# Patient Record
Sex: Male | Born: 1960 | Race: White | Hispanic: No | Marital: Married | Smoking: Never smoker
Health system: Southern US, Community
[De-identification: ages and names within clinical notes are randomized; demographics above are authoritative.]

## PROBLEM LIST (undated history)

## (undated) DIAGNOSIS — K219 Gastro-esophageal reflux disease without esophagitis: Secondary | ICD-10-CM

## (undated) DIAGNOSIS — B0052 Herpesviral keratitis: Secondary | ICD-10-CM

## (undated) DIAGNOSIS — I10 Essential (primary) hypertension: Secondary | ICD-10-CM

## (undated) DIAGNOSIS — H269 Unspecified cataract: Secondary | ICD-10-CM

## (undated) HISTORY — PX: EYE SURGERY: SHX253

## (undated) HISTORY — PX: VASECTOMY: SHX75

## (undated) HISTORY — DX: Herpesviral keratitis: B00.52

## (undated) HISTORY — DX: Essential (primary) hypertension: I10

## (undated) HISTORY — DX: Unspecified cataract: H26.9

## (undated) HISTORY — DX: Gastro-esophageal reflux disease without esophagitis: K21.9

---

## 2011-10-26 ENCOUNTER — Ambulatory Visit (INDEPENDENT_AMBULATORY_CARE_PROVIDER_SITE_OTHER): Payer: 59

## 2011-10-26 DIAGNOSIS — H5710 Ocular pain, unspecified eye: Secondary | ICD-10-CM

## 2011-11-15 ENCOUNTER — Other Ambulatory Visit: Payer: Self-pay | Admitting: Internal Medicine

## 2011-11-21 ENCOUNTER — Other Ambulatory Visit: Payer: Self-pay | Admitting: Internal Medicine

## 2011-12-11 ENCOUNTER — Encounter: Payer: Self-pay | Admitting: Physician Assistant

## 2011-12-13 ENCOUNTER — Encounter: Payer: Self-pay | Admitting: Physician Assistant

## 2011-12-13 ENCOUNTER — Ambulatory Visit (INDEPENDENT_AMBULATORY_CARE_PROVIDER_SITE_OTHER): Payer: 59 | Admitting: Physician Assistant

## 2011-12-13 VITALS — BP 119/75 | HR 70 | Temp 97.4°F | Resp 16 | Ht 70.5 in | Wt 206.0 lb

## 2011-12-13 DIAGNOSIS — Z0000 Encounter for general adult medical examination without abnormal findings: Secondary | ICD-10-CM

## 2011-12-13 DIAGNOSIS — I10 Essential (primary) hypertension: Secondary | ICD-10-CM

## 2011-12-13 DIAGNOSIS — K219 Gastro-esophageal reflux disease without esophagitis: Secondary | ICD-10-CM

## 2011-12-13 LAB — POCT URINALYSIS DIPSTICK
Nitrite, UA: NEGATIVE
Protein, UA: NEGATIVE
Spec Grav, UA: >=1.03
Urobilinogen, UA: 0.2

## 2011-12-13 LAB — CBC WITH DIFFERENTIAL/PLATELET
Basophils Absolute: 0 10*3/uL (ref 0.0–0.1)
Basophils Relative: 1 % (ref 0–1)
Lymphocytes Relative: 36 % (ref 12–46)
MCHC: 35.2 g/dL (ref 30.0–36.0)
Neutro Abs: 3.1 10*3/uL (ref 1.7–7.7)
Neutrophils Relative %: 52 % (ref 43–77)
RDW: 13 % (ref 11.5–15.5)
WBC: 5.9 10*3/uL (ref 4.0–10.5)

## 2011-12-13 LAB — COMPREHENSIVE METABOLIC PANEL
ALT: 59 U/L — ABNORMAL HIGH (ref 0–53)
AST: 32 U/L (ref 0–37)
Albumin: 4.6 g/dL (ref 3.5–5.2)
Alkaline Phosphatase: 72 U/L (ref 39–117)
Chloride: 104 mEq/L (ref 96–112)
Potassium: 5 mEq/L (ref 3.5–5.3)
Sodium: 138 mEq/L (ref 135–145)
Total Protein: 7 g/dL (ref 6.0–8.3)

## 2011-12-13 LAB — LIPID PANEL
LDL Cholesterol: 92 mg/dL (ref 0–99)
Triglycerides: 197 mg/dL — ABNORMAL HIGH (ref <150)
VLDL: 39 mg/dL (ref 0–40)

## 2011-12-13 LAB — TSH: TSH: 0.909 u[IU]/mL (ref 0.350–4.500)

## 2011-12-13 MED ORDER — PANTOPRAZOLE SODIUM 40 MG PO TBEC: 40.0000 mg | DELAYED_RELEASE_TABLET | Freq: Every day | ORAL | Status: DC

## 2011-12-13 MED ORDER — LISINOPRIL 10 MG PO TABS: 10.0000 mg | ORAL_TABLET | Freq: Every day | ORAL | Status: DC

## 2011-12-13 NOTE — Progress Notes (Signed)
  Subjective:    Patient ID: Thomas Brady, male    DOB: Mar 07, 1961, 51 y.o.   MRN: [id number]  HPI  See scanned in form.  Here for CPE.  No problems except shingles R eye, now resolved.  Still seeing ophthalmologist.  Pt has minimal and resolving photophobia.  Review of Systems  All other systems reviewed and are negative.       Objective:   Physical Exam  Constitutional: He is oriented to person, place, and time. He appears well-developed and well-nourished.  HENT:  Head: Normocephalic and atraumatic.  Right Ear: External ear normal.  Left Ear: External ear normal.  Nose: Nose normal.  Mouth/Throat: Oropharynx is clear and moist. No oropharyngeal exudate.  Eyes: Conjunctivae and EOM are normal. Right eye exhibits no discharge. Left eye exhibits no discharge. No scleral icterus.  Neck: Normal range of motion. Neck supple. No thyromegaly present.  Cardiovascular: Normal rate, regular rhythm, normal heart sounds and intact distal pulses.  Exam reveals no gallop and no friction rub.   No murmur heard. Pulmonary/Chest: Effort normal and breath sounds normal.  Abdominal: Soft. Bowel sounds are normal.  Musculoskeletal: Normal range of motion.  Lymphadenopathy:    He has no cervical adenopathy.  Neurological: He is alert and oriented to person, place, and time. He has normal reflexes. No cranial nerve deficit.  Skin: Skin is warm and dry.  Psychiatric: He has a normal mood and affect. His behavior is normal.          Assessment & Plan:  CPE-healthy male.  Htn is stable.  Reflux is stable.  Continue current meds.  Check labs.

## 2011-12-13 NOTE — Patient Instructions (Signed)
See age appropriate handout given.

## 2012-12-16 ENCOUNTER — Other Ambulatory Visit: Payer: Self-pay | Admitting: Physician Assistant

## 2013-01-17 ENCOUNTER — Other Ambulatory Visit: Payer: Self-pay | Admitting: Physician Assistant

## 2013-02-24 ENCOUNTER — Ambulatory Visit (INDEPENDENT_AMBULATORY_CARE_PROVIDER_SITE_OTHER): Payer: BC Managed Care – PPO | Admitting: Family Medicine

## 2013-02-24 VITALS — BP 126/84 | HR 101 | Temp 97.0°F | Resp 16 | Ht 70.0 in | Wt 212.0 lb

## 2013-02-24 DIAGNOSIS — I10 Essential (primary) hypertension: Secondary | ICD-10-CM

## 2013-02-24 DIAGNOSIS — Z79899 Other long term (current) drug therapy: Secondary | ICD-10-CM

## 2013-02-24 DIAGNOSIS — K219 Gastro-esophageal reflux disease without esophagitis: Secondary | ICD-10-CM

## 2013-02-24 DIAGNOSIS — Z1211 Encounter for screening for malignant neoplasm of colon: Secondary | ICD-10-CM

## 2013-02-24 LAB — COMPREHENSIVE METABOLIC PANEL
AST: 35 U/L (ref 0–37)
Alkaline Phosphatase: 67 U/L (ref 39–117)
BUN: 8 mg/dL (ref 6–23)
Calcium: 9.7 mg/dL (ref 8.4–10.5)
Chloride: 106 mEq/L (ref 96–112)
Creat: 1.39 mg/dL — ABNORMAL HIGH (ref 0.50–1.35)
Total Bilirubin: 0.6 mg/dL (ref 0.3–1.2)

## 2013-02-24 MED ORDER — VALACYCLOVIR HCL 500 MG PO TABS: 500.0000 mg | ORAL_TABLET | Freq: Every day | ORAL | Status: DC

## 2013-02-24 MED ORDER — PANTOPRAZOLE SODIUM 40 MG PO TBEC: DELAYED_RELEASE_TABLET | ORAL | Status: DC

## 2013-02-24 MED ORDER — LISINOPRIL 10 MG PO TABS: 10.0000 mg | ORAL_TABLET | Freq: Every day | ORAL | Status: DC

## 2013-02-24 NOTE — Progress Notes (Signed)
Subjective:    Patient ID: Thomas Brady, male    DOB: [birth year], 52 y.o.   MRN: [id number] Chief Complaint  Patient presents with  . Medication Refill   HPI  Mr. Mcgath is doing well.  He is not yet out of any of his medications. He has never had any gastritis or heartburn but does have a constant tickle in his throat along with a chronic dry cough which he has attributed to uncontrolled GERD. He used to be on bid protonix 40mg  but now on qd. Is wondering though if he needs to step up therapy again to help with the cough.  Never misses his lisinopril.  Thinks the cough was present before he started on lisinopril. 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 Does not check bp outside office. No problems w/ compliance or tolerating daily bp med.  Past Medical History  Diagnosis Date  . Hypertension   . GERD (gastroesophageal reflux disease)    Current Outpatient Prescriptions on File Prior to Visit  Medication Sig Dispense Refill  . aspirin 81 MG tablet Take 81 mg by mouth daily.      . fish oil-omega-3 fatty acids 1000 MG capsule Take 1 g by mouth daily.       No current facility-administered medications on file prior to visit.   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 History   Social History  . Marital Status: Married    Spouse Name: N/A    Number of Children: N/A  . Years of Education: N/A   Social History Main Topics  . Smoking status: Never Smoker   . Smokeless tobacco: None  . Alcohol Use: No  . Drug Use: No  . Sexually Active: Yes    Birth Control/ Protection: Surgical   Other Topics Concern  . None   Social History Narrative  . None   Family History  Problem Relation Age of Onset  . Hyperlipidemia Father   . Hypertension Father   . Hyperlipidemia Mother     Review of Systems  Constitutional: Negative for fever and chills.  Eyes: Negative for visual disturbance.  Respiratory: Positive for  cough. Negative for shortness of breath.   Cardiovascular: Negative for chest pain and leg swelling.  Gastrointestinal: Negative for abdominal pain.  Skin: Negative for rash.  Neurological: Negative for dizziness, syncope, facial asymmetry, weakness, light-headedness and headaches.      BP 126/84  Pulse 101  Temp(Src) 97 F (36.1 C) (Oral)  Resp 16  Ht 5\' 10"  (1.778 m)  Wt 212 lb (96.163 kg)  BMI 30.42 kg/m2  SpO2 96% Objective:   Physical Exam  Constitutional: He is oriented to person, place, and time. He appears well-developed and well-nourished. No distress.  HENT:  Head: Normocephalic and atraumatic.  Eyes: Conjunctivae are normal. Pupils are equal, round, and reactive to light. No scleral icterus.  Neck: Normal range of motion. Neck supple. No thyromegaly present.  Cardiovascular: Normal rate, regular rhythm, normal heart sounds and intact distal pulses.   Pulmonary/Chest: Effort normal and breath sounds normal. No respiratory distress.  Musculoskeletal: He exhibits no edema.  Lymphadenopathy:    He has no cervical adenopathy.  Neurological: He is alert and oriented to person, place, and time.  Skin: Skin is warm and dry. He is not diaphoretic.  Psychiatric: He has a normal mood and affect. His behavior is normal.          Assessment &  Plan:  Special screening for malignant neoplasms, colon - Plan: Ambulatory referral to Gastroenterology for initial routine screening colonoscopy.  Encounter for long-term (current) use of other medications - Plan: Comprehensive metabolic panel.  Suspect lisinopril is causing cough - hold x 1 wk to see if cough resolves. If it does not - restart lisinopril 10 and cons stepping up therapy for gerd.   GERD - consider trying to wean off ppi in future if cough not GERD/LRD related.  HTN - Cont lisinopril if no side effects. However, if cough resolves after 1 wk off lisinopril. Rec checking bp outside office several times to see if he needs  to restart alternative med.  Occular HSV - cont suppression valtrex  HM - nml annual CPE last yr - rec repeat in 2015 w/ fasting labs.  Meds ordered this encounter  Medications  . DISCONTD: valACYclovir (VALTREX) 500 MG tablet    Sig: Take 500 mg by mouth daily.  . pantoprazole (PROTONIX) 40 MG tablet    Sig: TAKE 1 TABLET BY MOUTH EVERY DAY    Dispense:  90 tablet    Refill:  3  . lisinopril (PRINIVIL,ZESTRIL) 10 MG tablet    Sig: Take 1 tablet (10 mg total) by mouth daily.    Dispense:  90 tablet    Refill:  3  . valACYclovir (VALTREX) 500 MG tablet    Sig: Take 1 tablet (500 mg total) by mouth daily.    Dispense:  90 tablet    Refill:  3

## 2013-03-10 ENCOUNTER — Other Ambulatory Visit: Payer: Self-pay | Admitting: Gastroenterology

## 2013-03-16 ENCOUNTER — Ambulatory Visit
Admission: RE | Admit: 2013-03-16 | Discharge: 2013-03-16 | Disposition: A | Discharge status: Home / Self Care | Payer: BC Managed Care – PPO | Source: Ambulatory Visit | Attending: Gastroenterology | Admitting: Gastroenterology

## 2013-03-18 ENCOUNTER — Telehealth: Payer: Self-pay | Admitting: Radiology

## 2013-03-18 NOTE — Telephone Encounter (Signed)
Patient phone call per Dr Cleta Alberts, normal [place], pt has been advised by Dr Elnoria Howard already.

## 2013-03-19 ENCOUNTER — Ambulatory Visit (INDEPENDENT_AMBULATORY_CARE_PROVIDER_SITE_OTHER): Payer: BC Managed Care – PPO | Admitting: Family Medicine

## 2013-03-19 VITALS — BP 120/80 | HR 81 | Temp 98.6°F | Resp 17 | Ht 70.5 in | Wt 216.8 lb

## 2013-03-19 DIAGNOSIS — J029 Acute pharyngitis, unspecified: Secondary | ICD-10-CM

## 2013-03-19 LAB — POCT RAPID STREP A (OFFICE): Rapid Strep A Screen: NEGATIVE

## 2013-03-19 MED ORDER — PENICILLIN V POTASSIUM 500 MG PO TABS: 500.0000 mg | ORAL_TABLET | Freq: Three times a day (TID) | ORAL | Status: DC

## 2013-03-19 NOTE — Progress Notes (Signed)
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 Is no fever, cough, nausea, vomiting, or ear pain  Objective:  Alert, NAD Neck supple, no adenopathy Throat:  Swollen pendulous uvula Results for orders placed in visit on 02/24/13  COMPREHENSIVE METABOLIC PANEL      Result Value Range   Sodium 142  135 - 145 mEq/L   Potassium 4.4  3.5 - 5.3 mEq/L   Chloride 106  96 - 112 mEq/L   CO2 27  19 - 32 mEq/L   Glucose, Bld 111 (*) 70 - 99 mg/dL   BUN 8  6 - 23 mg/dL   Creat 1.61 (*) 0.96 - 1.35 mg/dL   Total Bilirubin 0.6  0.3 - 1.2 mg/dL   Alkaline Phosphatase 67  39 - 117 U/L   AST 35  0 - 37 U/L   ALT 53  0 - 53 U/L   Total Protein 7.6  6.0 - 8.3 g/dL   Albumin 4.6  3.5 - 5.2 g/dL   Calcium 9.7  8.4 - 04.5 mg/dL   Results for orders placed in visit on 03/19/13  POCT RAPID STREP A (OFFICE)      Result Value Range   Rapid Strep A Screen Negative  Negative   ]   Assessment:  Acute pharyngitis  Acute pharyngitis - Plan: POCT rapid strep A, Culture, Group A Strep, penicillin v potassium (VEETID) 500 MG tablet  Signed, Elvina Sidle, MD

## 2014-05-10 DIAGNOSIS — Z0271 Encounter for disability determination: Secondary | ICD-10-CM

## 2014-05-16 ENCOUNTER — Other Ambulatory Visit: Payer: Self-pay | Admitting: Family Medicine

## 2014-05-24 ENCOUNTER — Encounter: Payer: Self-pay | Admitting: Family Medicine

## 2014-05-24 DIAGNOSIS — B0052 Herpesviral keratitis: Secondary | ICD-10-CM | POA: Insufficient documentation

## 2014-06-08 IMAGING — US US ABDOMEN COMPLETE
1 series · 14 of 25 positions shown · non-contrast
Comparison: None.

CLINICAL DATA: Elevated liver function studies.

COMPLETE ABDOMINAL ULTRASOUND

[Series 1: us abdomen complete · 0.26mm/px · 14 of 83 slices shown]
[im 1/83]
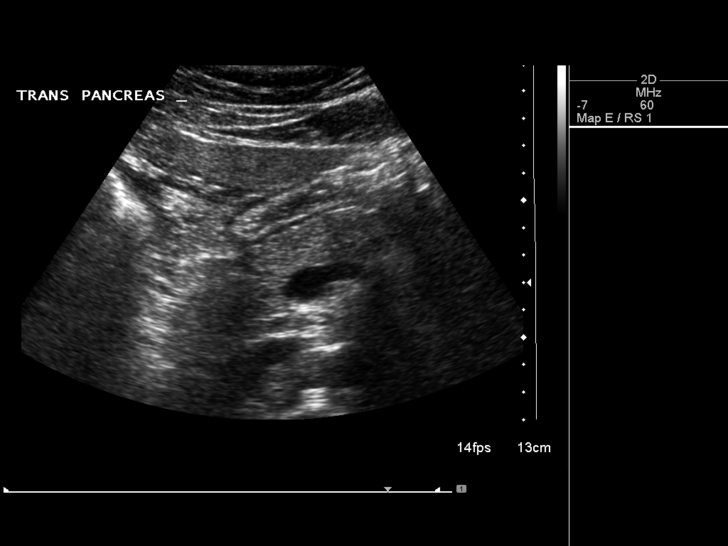
[im 7/83]
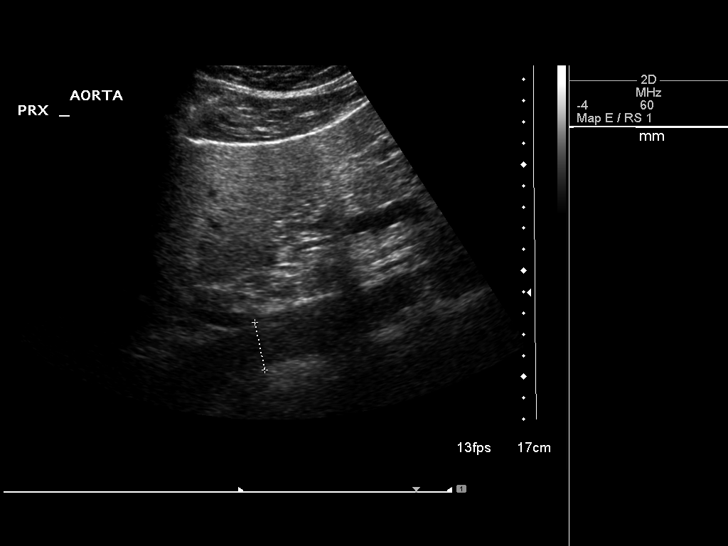
[im 14/83]
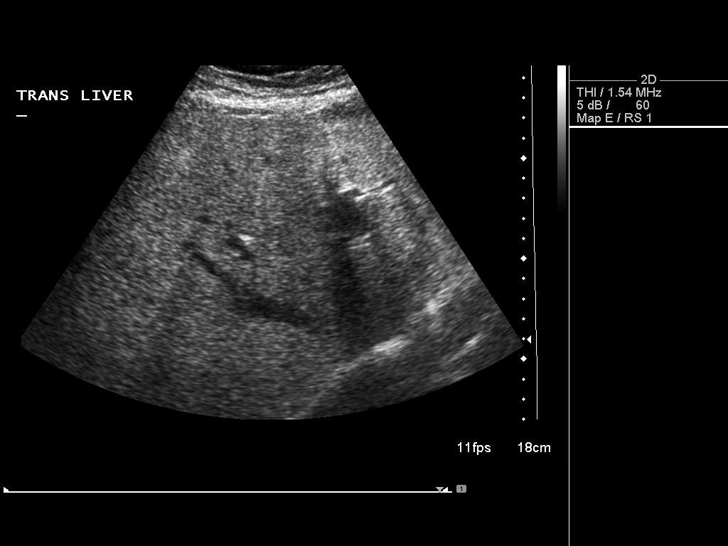
[im 21/83]
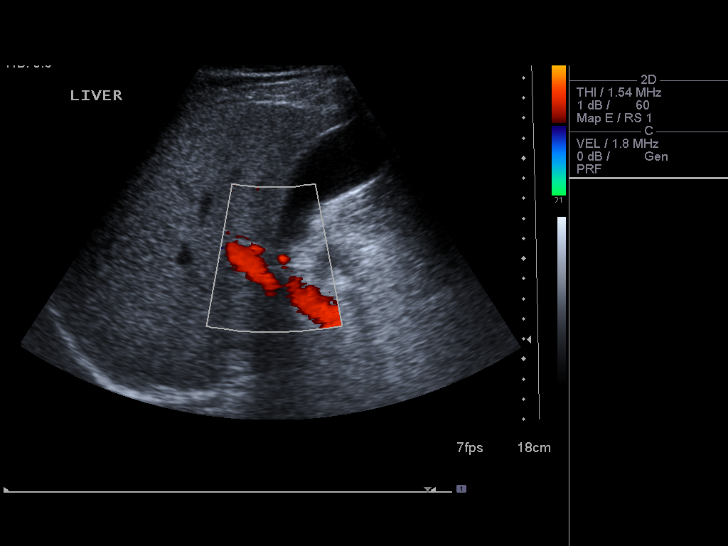
[im 28/83]
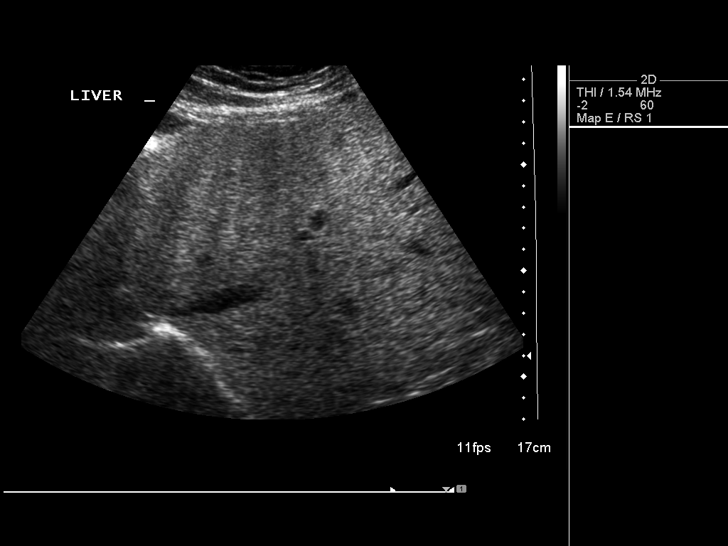
[im 31/83]
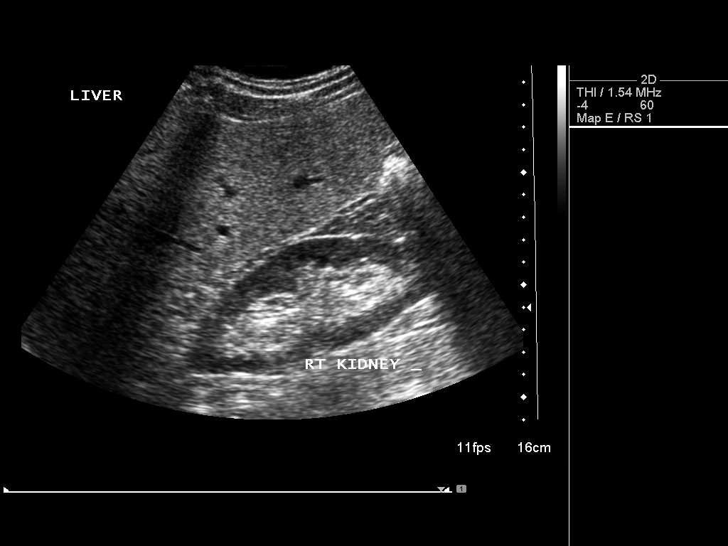
[im 38/83]
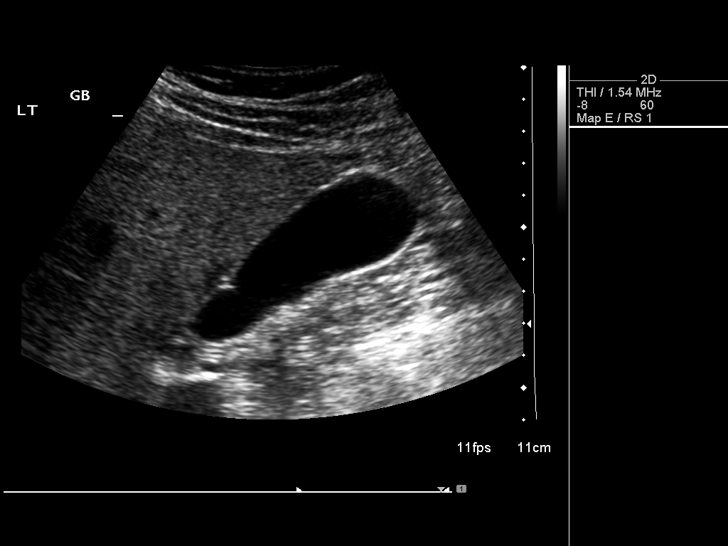
[im 45/83]
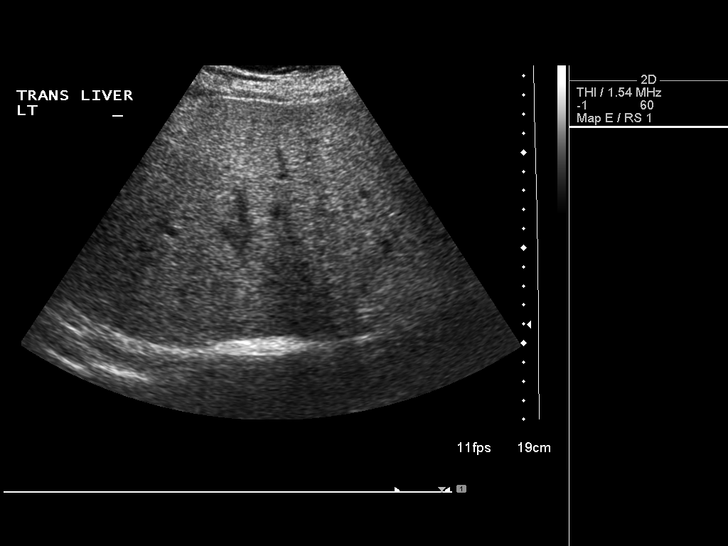
[im 52/83]
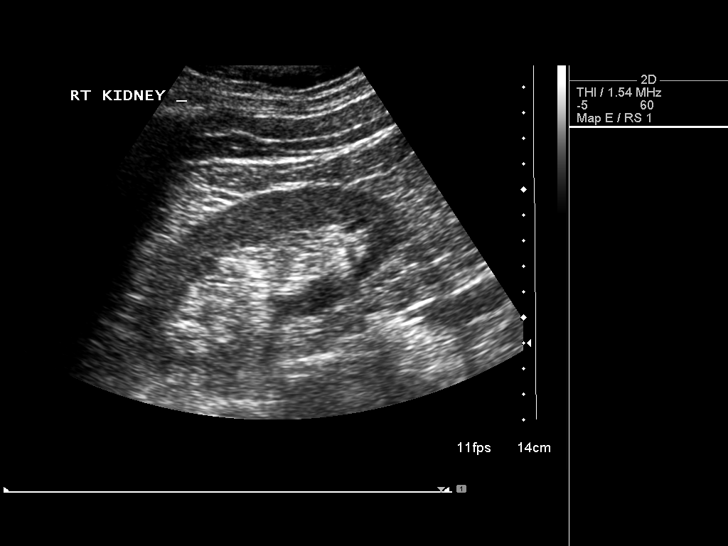
[im 55/83]
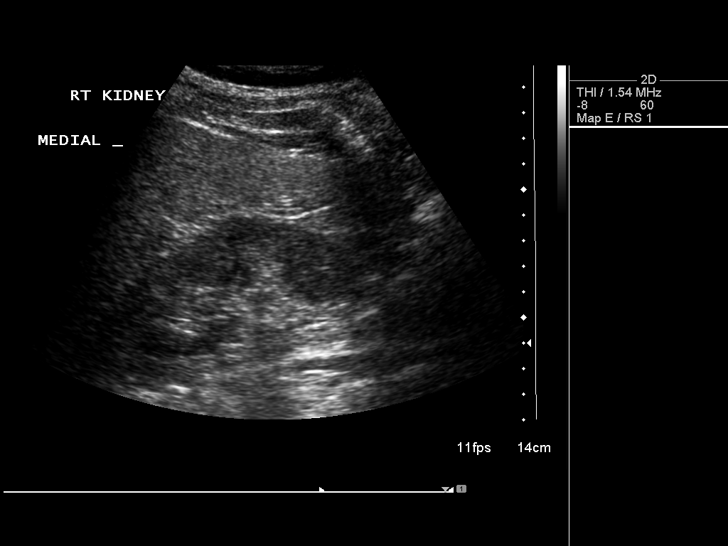
[im 62/83]
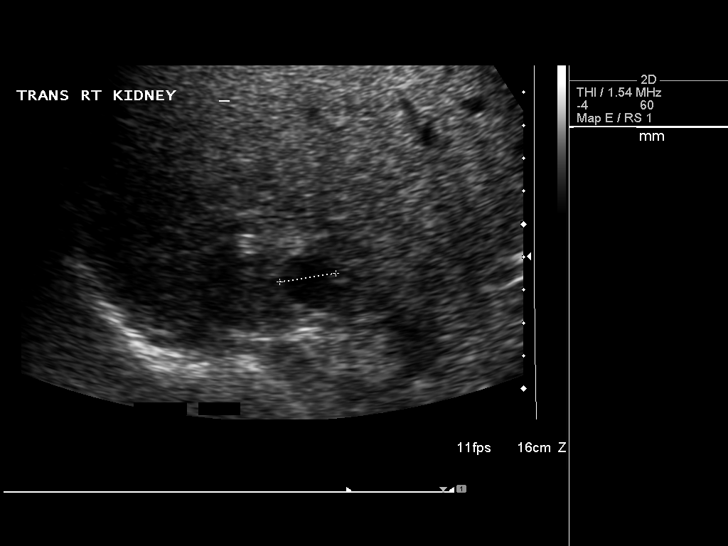
[im 69/83]
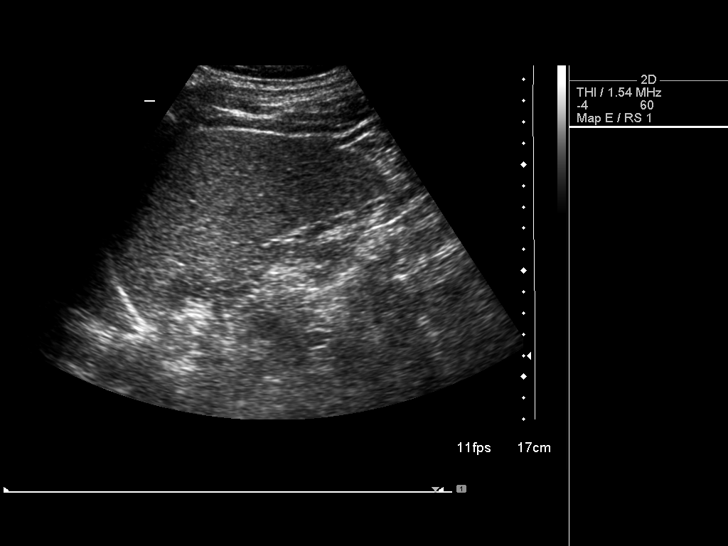
[im 76/83]
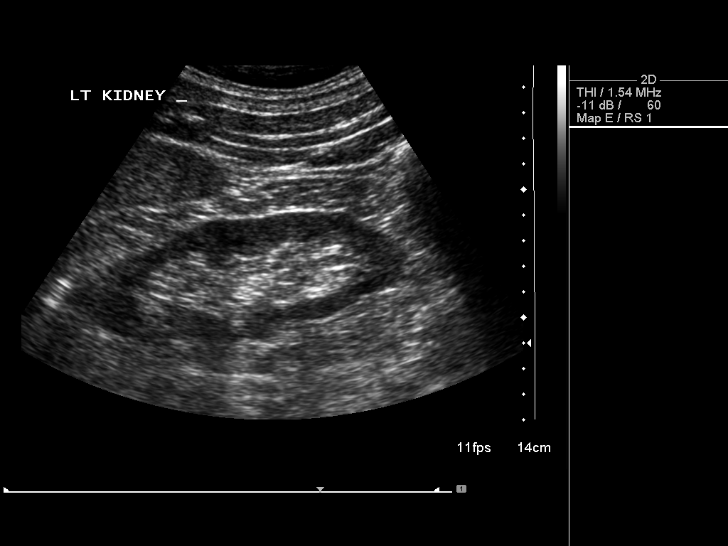
[im 83/83]
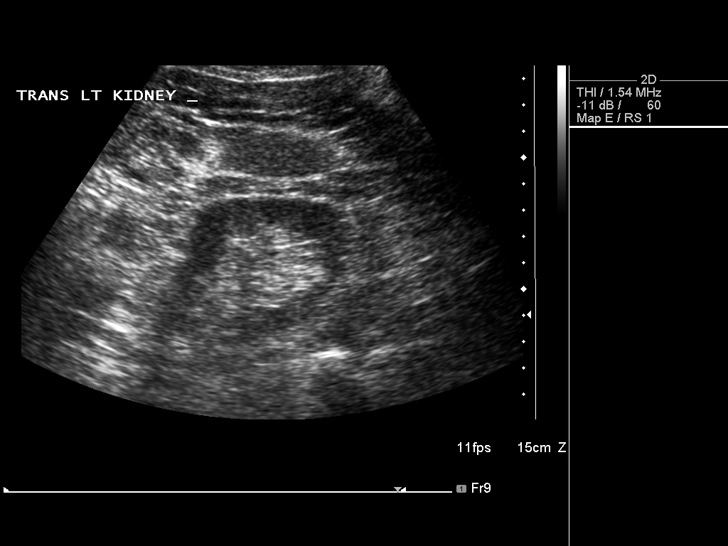

[14 of 25 positions shown; findings below may reference images not displayed]

FINDINGS: Gallbladder:  No gallstones, gallbladder wall thickening, or
pericholecystic fluid.

Common bile duct:  Normal in caliber measuring a maximum of 3.6mm.

Liver:  There is diffuse increased echogenicity of the liver and
decreased through transmission consistent with fatty infiltration.
No focal lesions or biliary dilatation. There is a simple appearing
1.3 x 0.8 x 1.0 cm cyst in the anterior aspect of the right lobe.

IVC:  Normal caliber.

Pancreas:  Sonographically unremarkable.

Spleen:  Normal size and echogenicity without focal lesions.

Right Kidney:  12.4 cm in length.  Normal renal cortical thickness
and echogenicity without hydronephrosis.  There is a 1.9 x 1.7 x
1.7 cm a simple appearing upper pole cyst and a 1.1 x 0.8 x 1.0 cm
lower pole cyst.

Left Kidney:  13.0 cm in length. Normal renal cortical thickness
and echogenicity without focal lesions or hydronephrosis.

Abdominal aorta:  Normal caliber.
IMPRESSION: 1.  Normal gallbladder and normal caliber common bile duct.
2.  Simple appearing hepatic and renal cysts.

## 2014-07-09 ENCOUNTER — Other Ambulatory Visit: Payer: Self-pay | Admitting: Family Medicine

## 2014-07-12 ENCOUNTER — Ambulatory Visit (INDEPENDENT_AMBULATORY_CARE_PROVIDER_SITE_OTHER): Payer: BC Managed Care – PPO | Admitting: Family Medicine

## 2014-07-12 VITALS — BP 110/78 | HR 71 | Temp 97.9°F | Resp 18 | Ht 70.25 in | Wt 218.8 lb

## 2014-07-12 DIAGNOSIS — H16201 Unspecified keratoconjunctivitis, right eye: Secondary | ICD-10-CM

## 2014-07-12 DIAGNOSIS — Z23 Encounter for immunization: Secondary | ICD-10-CM

## 2014-07-12 DIAGNOSIS — K219 Gastro-esophageal reflux disease without esophagitis: Secondary | ICD-10-CM

## 2014-07-12 DIAGNOSIS — I10 Essential (primary) hypertension: Secondary | ICD-10-CM

## 2014-07-12 MED ORDER — LISINOPRIL 10 MG PO TABS: 10.0000 mg | ORAL_TABLET | Freq: Every day | ORAL | Status: DC

## 2014-07-12 MED ORDER — PANTOPRAZOLE SODIUM 40 MG PO TBEC: 40.0000 mg | DELAYED_RELEASE_TABLET | Freq: Every day | ORAL | Status: DC

## 2014-07-12 MED ORDER — VALACYCLOVIR HCL 500 MG PO TABS: 500.0000 mg | ORAL_TABLET | Freq: Every day | ORAL | Status: DC

## 2014-07-12 NOTE — Progress Notes (Signed)
This chart was scribed for Elvina SidleKurt Keyvon Herter, MD by Marica OtterNusrat Rahman, ED Scribe at [facility]. This patient was seen in room Room 1 and the patient's care was started at 11:00 AM.  Patient ID: Thomas Brady MRN: [id number], DOB: 1961-07-08, 53 y.o. Date of Encounter: 07/12/2014, 11:00 AM  Primary Physician: No PCP Per Patient  Chief Complaint  Patient presents with  . Medication Refill    Lisinopril/Pantoprazole  . 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 Pt seeks a refill of Pantoprazole, Valtrex, lisinopril, and nexium. Pt denies experiencing any side effects from his meds. Pt reports he has been on the lisinopril for 3-5 years. Pt reports he takes steroid drops and acyclovir for a viral infection of the right eye (herpes keratitis, right eye). Pt also would like a flu shot today and is open to receiving a pneumonia shot.    Personal Hx: Pt is traveling to [place] in 5 days for two weeks to celebrate his wife's 50th birthday and his daughter's 8910th birthday. This will be pt's first visit to [place], though his wife lived there for 4-5 years.   Past Medical History  Diagnosis Date  . Hypertension   . GERD (gastroesophageal reflux disease)   . Herpes keratitis   . Cataract     Home Meds: Prior to Admission medications   Medication Sig Start Date End Date Taking? Authorizing Provider  fish oil-omega-3 fatty acids 1000 MG capsule Take 1 g by mouth daily.   Yes Historical Provider, MD  pantoprazole (PROTONIX) 40 MG tablet Take 1 tablet (40 mg total) by mouth daily. PATIENT NEEDS OFFICE VISIT FOR ADDITIONAL REFILLS 05/17/14  Yes Sherren MochaEva N Shaw, MD  valACYclovir (VALTREX) 500 MG tablet Take 1 tablet (500 mg total) by mouth daily. PATIENT NEEDS OFFICE VISIT FOR ADDITIONAL REFILLS 05/17/14  Yes Sherren MochaEva N Shaw, MD  lisinopril (PRINIVIL,ZESTRIL) 10 MG tablet Take 1 tablet (10 mg total) by mouth daily. 02/24/13  02/24/14  Sherren MochaEva N Shaw, MD  Allergies: No Known Allergies  History   Social History  . Marital Status: Married    Spouse Name: N/A    Number of Children: N/A  . Years of Education: N/A   Occupational History  . Not on file.   Social History Main Topics  . Smoking status: Never Smoker   . Smokeless tobacco: Not on file  . Alcohol Use: No  . Drug Use: No  . Sexual Activity: Yes    Birth Control/ Protection: Surgical   Other Topics Concern  . Not on file   Social History Narrative  . No narrative on file   Review of Systems: Constitutional: negative for chills, fever, night sweats, weight changes, or fatigue  HEENT: negative for vision changes, hearing loss, congestion, rhinorrhea, ST, epistaxis, or sinus pressure Cardiovascular: negative for chest pain or palpitations Respiratory: negative for hemoptysis, wheezing, shortness of breath, or cough Abdominal: negative for abdominal pain, nausea, vomiting, diarrhea, or constipation Dermatological: negative for rash Neurologic: negative for headache, dizziness, or syncope All other systems reviewed and are otherwise negative with the exception to those above and in the HPI.  Physical Exam: Blood pressure 110/78, pulse 71, temperature 97.9 F (36.6 C), temperature source Oral, resp. rate 18, height 5' 10.25" (1.784 m), weight 218 lb 12.8 oz (99.247 kg), SpO2 97.00%., Body mass index is 31.18 kg/(m^2). General: Well developed, well nourished, in no acute distress.  Head: Normocephalic, atraumatic, eyes without discharge, sclera non-icteric, nares are without discharge. Bilateral auditory canals clear, TM's are without perforation, pearly grey and translucent with reflective cone of light bilaterally. Oral cavity moist, posterior pharynx without exudate, erythema, peritonsillar abscess, or post nasal drip.  Neck: Supple. No thyromegaly. Full ROM. No lymphadenopathy. Lungs: Clear bilaterally to auscultation without wheezes, rales, or  rhonchi. Breathing is unlabored. Heart: RRR with S1 S2. No murmurs, rubs, or gallops appreciated. Abdomen: Soft, non-tender, non-distended with normoactive bowel sounds. No hepatomegaly. No rebound/guarding. No obvious abdominal masses. Msk:  Strength and tone normal for age. Extremities/Skin: Warm and dry. No clubbing or cyanosis. No edema. No rashes or suspicious lesions. Neuro: Alert and oriented X 3. Moves all extremities spontaneously. Gait is normal. CNII-XII grossly in tact. Psych:  Responds to questions appropriately with a normal affect.   Labs: ASSESSMENT AND PLAN:  DIAGNOSTIC STUDIES: Oxygen Saturation is 97% on RA, nl by my interpretation.    COORDINATION OF CARE: 11:04 AM-Discussed treatment plan which includes meds refill, flu shot, pneumonia shot, and advise pt to skip a couple of doses of pantoprozole each week, with pt at bedside and pt agreed to plan.   53 y.o. year old male with Essential hypertension - Plan: lisinopril (PRINIVIL,ZESTRIL) 10 MG tablet, DISCONTINUED: lisinopril (PRINIVIL,ZESTRIL) 10 MG tablet  Keratoconjunctivitis, right - Plan: valACYclovir (VALTREX) 500 MG tablet  Gastroesophageal reflux disease without esophagitis - Plan: pantoprazole (PROTONIX) 40 MG tablet  Need for prophylactic vaccination and inoculation against influenza - Plan: Flu Vaccine QUAD 36+ mos IM  Need for vaccination with 13-polyvalent pneumococcal conjugate vaccine - Plan: Pneumococcal conjugate vaccine 13-valent IM     Signed, Elvina SidleKurt Elisama Thissen, MD 07/12/2014 11:00 AM  I personally performed the services described in this documentation, which was scribed in my presence. The recorded information has been reviewed and is accurate.

## 2014-07-12 NOTE — Patient Instructions (Signed)
Immunization Schedule, Adult  Influenza vaccine.  All adults should be immunized every year.  All adults, including pregnant women and people with hives-only allergy to eggs can receive the inactivated influenza (IIV) vaccine.  Adults aged 52-49 years can receive the recombinant influenza (RIV) vaccine. The RIV vaccine does not contain any egg protein.  Adults aged 76 years or older can receive the standard-dose IIV or the high-dose IIV.  Tetanus, diphtheria, and acellular pertussis (Td, Tdap) vaccine.  Pregnant women should receive 1 dose of Tdap vaccine during each pregnancy. The dose should be obtained regardless of the length of time since the last dose. Immunization is preferred during the 27th to 36th week of gestation.  An adult who has not previously received Tdap or who does not know his or her vaccine status should receive 1 dose of Tdap. This initial dose should be followed by tetanus and diphtheria toxoids (Td) booster doses every 10 years.  Adults with an unknown or incomplete history of completing a 3-dose immunization series with Td-containing vaccines should begin or complete a primary immunization series including a Tdap dose.  Adults should receive a Td booster every 10 years.  Varicella vaccine.  An adult without evidence of immunity to varicella should receive 2 doses or a second dose if he or she has previously received 1 dose.  Pregnant females who do not have evidence of immunity should receive the first dose after pregnancy. This first dose should be obtained before leaving the health care facility. The second dose should be obtained 4-8 weeks after the first dose.  Human papillomavirus (HPV) vaccine.  Females aged 13-26 years who have not received the vaccine previously should obtain the 3-dose series.  The vaccine is not recommended for use in pregnant females. However, pregnancy testing is not needed before receiving a dose. If a male is found to be  pregnant after receiving a dose, no treatment is needed. In that case, the remaining doses should be delayed until after the pregnancy.  Males aged 37-21 years who have not received the vaccine previously should receive the 3-dose series. Males aged 22-26 years may be immunized.  Immunization is recommended through the age of 93 years for any male who has sex with males and did not get any or all doses earlier.  Immunization is recommended for any person with an immunocompromised condition through the age of 71 years if he or she did not get any or all doses earlier.  During the 3-dose series, the second dose should be obtained 4-8 weeks after the first dose. The third dose should be obtained 24 weeks after the first dose and 16 weeks after the second dose.  Zoster vaccine.  One dose is recommended for adults aged 73 years or older unless certain conditions are present.  Measles, mumps, and rubella (MMR) vaccine.  Adults born before 35 generally are considered immune to measles and mumps.  Adults born in [birth year] or later should have 1 or more doses of MMR vaccine unless there is a contraindication to the vaccine or there is laboratory evidence of immunity to each of the three diseases.  A routine second dose of MMR vaccine should be obtained at least 28 days after the first dose for students attending postsecondary schools, health care workers, or international travelers.  People who received inactivated measles vaccine or an unknown type of measles vaccine during 1963-1967 should receive 2 doses of MMR vaccine.  People who received inactivated mumps vaccine or an unknown type  of mumps vaccine before 1979 and are at high risk for mumps infection should consider immunization with 2 doses of MMR vaccine.  For females of childbearing age, rubella immunity should be determined. If there is no evidence of immunity, females who are not pregnant should be vaccinated. If there is no evidence of  immunity, females who are pregnant should delay immunization until after pregnancy.  Unvaccinated health care workers born before 1957 who lack laboratory evidence of measles, mumps, or rubella immunity or laboratory confirmation of disease should consider measles and mumps immunization with 2 doses of MMR vaccine or rubella immunization with 1 dose of MMR vaccine.  Pneumococcal 13-valent conjugate (PCV13) vaccine.  When indicated, a person who is uncertain of his or her immunization history and has no record of immunization should receive the PCV13 vaccine.  An adult aged 19 years or older who has certain medical conditions and has not been previously immunized should receive 1 dose of PCV13 vaccine. This PCV13 should be followed with a dose of pneumococcal polysaccharide (PPSV23) vaccine. The PPSV23 vaccine dose should be obtained at least 8 weeks after the dose of PCV13 vaccine.  An adult aged 19 years or older who has certain medical conditions and previously received 1 or more doses of PPSV23 vaccine should receive 1 dose of PCV13. The PCV13 vaccine dose should be obtained 1 or more years after the last PPSV23 vaccine dose.  Pneumococcal polysaccharide (PPSV23) vaccine.  When PCV13 is also indicated, PCV13 should be obtained first.  All adults aged 65 years and older should be immunized.  An adult younger than age 65 years who has certain medical conditions should be immunized.  Any person who resides in a nursing home or long-term care facility should be immunized.  An adult smoker should be immunized.  People with an immunocompromised condition and certain other conditions should receive both PCV13 and PPSV23 vaccines.  People with human immunodeficiency virus (HIV) infection should be immunized as soon as possible after diagnosis.  Immunization during chemotherapy or radiation therapy should be avoided.  Routine use of PPSV23 vaccine is not recommended for American Indians,  [place] Natives, or people younger than 65 years unless there are medical conditions that require PPSV23 vaccine.  When indicated, people who have unknown immunization and have no record of immunization should receive PPSV23 vaccine.  One-time revaccination 5 years after the first dose of PPSV23 is recommended for people aged 19-64 years who have chronic kidney failure, nephrotic syndrome, asplenia, or immunocompromised conditions.  People who received 1-2 doses of PPSV23 before age 65 years should receive another dose of PPSV23 vaccine at age 65 years or later if at least 5 years have passed since the previous dose.  Doses of PPSV23 are not needed for people immunized with PPSV23 at or after age 65 years.  Meningococcal vaccine.  Adults with asplenia or persistent complement component deficiencies should receive 2 doses of quadrivalent meningococcal conjugate (MenACWY-D) vaccine. The doses should be obtained at least 2 months apart.  Microbiologists working with certain meningococcal bacteria, military recruits, people at risk during an outbreak, and people who travel to or live in countries with a high rate of meningitis should be immunized.  A first-year college student up through age 21 years who is living in a residence hall should receive a dose if he or she did not receive a dose on or after his or her 16th birthday.  Adults who have certain high-risk conditions should receive one or more doses   of vaccine.  Hepatitis A vaccine.  Adults who wish to be protected from this disease, have certain high-risk conditions, work with hepatitis A-infected animals, work in hepatitis A research labs, or travel to or work in countries with a high rate of hepatitis A should be immunized.  Adults who were previously unvaccinated and who anticipate close contact with an international adoptee during the first 60 days after arrival in the [place] from a country with a high rate of hepatitis A should  be immunized.  Hepatitis B vaccine.  Adults who wish to be protected from this disease, have certain high-risk conditions, may be exposed to blood or other infectious body fluids, are household contacts or sex partners of hepatitis B positive people, are clients or workers in certain care facilities, or travel to or work in countries with a high rate of hepatitis B should be immunized.  Haemophilus influenzae type b (Hib) vaccine.  A previously unvaccinated person with asplenia or sickle cell disease or having a scheduled splenectomy should receive 1 dose of Hib vaccine.  Regardless of previous immunization, a recipient of a hematopoietic stem cell transplant should receive a 3-dose series 6-12 months after his or her successful transplant.  Hib vaccine is not recommended for adults with HIV infection. Document Released: 12/07/2003 Document Revised: 01/11/2013 Document Reviewed: 11/03/2012 Exit[employer] Patient Information 2015 [employer]. This information is not intended to replace advice given to you by your health care provider. Make sure you discuss any questions you have with your health care provider. Hypertension Hypertension, commonly called high blood pressure, is when the force of blood pumping through your arteries is too strong. Your arteries are the blood vessels that carry blood from your heart throughout your body. A blood pressure reading consists of a higher number over a lower number, such as 110/72. The higher number (systolic) is the pressure inside your arteries when your heart pumps. The lower number (diastolic) is the pressure inside your arteries when your heart relaxes. Ideally you want your blood pressure below 120/80. Hypertension forces your heart to work harder to pump blood. Your arteries may become narrow or stiff. Having hypertension puts you at risk for heart disease, stroke, and other problems.  RISK FACTORS Some risk factors for high blood pressure are  controllable. Others are not.  Risk factors you cannot control include:   Race. You may be at higher risk if you are African American.  Age. Risk increases with age.  Gender. Men are at higher risk than women before age 45 years. After age 65, women are at higher risk than men. Risk factors you can control include:  Not getting enough exercise or physical activity.  Being overweight.  Getting too much fat, sugar, calories, or salt in your diet.  Drinking too much alcohol. SIGNS AND SYMPTOMS Hypertension does not usually cause signs or symptoms. Extremely high blood pressure (hypertensive crisis) may cause headache, anxiety, shortness of breath, and nosebleed. DIAGNOSIS  To check if you have hypertension, your health care provider will measure your blood pressure while you are seated, with your arm held at the level of your heart. It should be measured at least twice using the same arm. Certain conditions can cause a difference in blood pressure between your right and left arms. A blood pressure reading that is higher than normal on one occasion does not mean that you need treatment. If one blood pressure reading is high, ask your health care provider about having it checked again. TREATMENT    Treating high blood pressure includes making lifestyle changes and possibly taking medicine. Living a healthy lifestyle can help lower high blood pressure. You may need to change some of your habits. Lifestyle changes may include:  Following the DASH diet. This diet is high in fruits, vegetables, and whole grains. It is low in salt, red meat, and added sugars.  Getting at least 2 hours of brisk physical activity every week.  Losing weight if necessary.  Not smoking.  Limiting alcoholic beverages.  Learning ways to reduce stress. If lifestyle changes are not enough to get your blood pressure under control, your health care provider may prescribe medicine. You may need to take more than one.  Work closely with your health care provider to understand the risks and benefits. HOME CARE INSTRUCTIONS  Have your blood pressure rechecked as directed by your health care provider.   Take medicines only as directed by your health care provider. Follow the directions carefully. Blood pressure medicines must be taken as prescribed. The medicine does not work as well when you skip doses. Skipping doses also puts you at risk for problems.   Do not smoke.   Monitor your blood pressure at home as directed by your health care provider. SEEK MEDICAL CARE IF:   You think you are having a reaction to medicines taken.  You have recurrent headaches or feel dizzy.  You have swelling in your ankles.  You have trouble with your vision. SEEK IMMEDIATE MEDICAL CARE IF:  You develop a severe headache or confusion.  You have unusual weakness, numbness, or feel faint.  You have severe chest or abdominal pain.  You vomit repeatedly.  You have trouble breathing. MAKE SURE YOU:   Understand these instructions.  Will watch your condition.  Will get help right away if you are not doing well or get worse. Document Released: 09/16/2005 Document Revised: 01/31/2014 Document Reviewed: 07/09/2013 Exit[employer] Patient Information 2015 [employer]. This information is not intended to replace advice given to you by your health care provider. Make sure you discuss any questions you have with your health care provider.  

## 2014-12-11 ENCOUNTER — Ambulatory Visit (INDEPENDENT_AMBULATORY_CARE_PROVIDER_SITE_OTHER): Payer: BLUE CROSS/BLUE SHIELD | Admitting: Physician Assistant

## 2014-12-11 VITALS — BP 120/82 | HR 91 | Temp 98.5°F | Resp 20 | Ht 70.75 in | Wt 218.1 lb

## 2014-12-11 DIAGNOSIS — T148XXA Other injury of unspecified body region, initial encounter: Secondary | ICD-10-CM

## 2014-12-11 DIAGNOSIS — T148 Other injury of unspecified body region: Secondary | ICD-10-CM | POA: Diagnosis not present

## 2014-12-11 MED ORDER — CYCLOBENZAPRINE HCL 10 MG PO TABS: 10.0000 mg | ORAL_TABLET | Freq: Three times a day (TID) | ORAL | Status: DC | PRN

## 2014-12-11 MED ORDER — MELOXICAM 15 MG PO TABS: 15.0000 mg | ORAL_TABLET | Freq: Every day | ORAL | Status: DC

## 2014-12-11 NOTE — Patient Instructions (Signed)
Use the mobic once per day.  You do not need to use ibuprofen with this medication.  Do not overly extend the arm at this time.    Do gentle stretches of the wrist daily.Wrist Exercises RANGE OF MOTION (ROM) AND STRETCHING EXERCISES - Wrist Sprain  These exercises may help you when beginning to rehabilitate your injury. Your symptoms may resolve with or without further involvement from your physician, physical therapist, or athletic trainer. While completing these exercises, remember:   Restoring tissue flexibility helps normal motion to return to the joints. This allows healthier, less painful movement and activity.  An effective stretch should be held for at least 30 seconds.  A stretch should never be painful. You should only feel a gentle lengthening or release in the stretched tissue. RANGE OF MOTION - Wrist Flexion, Active-Assisted  Extend your right / left elbow with your palm pointing down.*  Gently pull the back of your hand toward you until you feel a gentle stretch on the top of your forearm.  Hold this position for __________ seconds. Repeat __________ times. Complete this exercise __________ times per day.  *If directed by your physician, physical therapist, or athletic trainer, complete this stretch with your elbow bent rather than extended. RANGE OF MOTION - Wrist Extension, Active-Assisted   Extend your right / left elbow and turn your palm upward.*  Gently pull your palm/fingertips back so your wrist extends and your fingers point more toward the ground.  You should feel a gentle stretch on the inside of your forearm.  Hold this position for __________ seconds. Repeat __________ times. Complete this exercise __________ times per day. *If directed by your physician, physical therapist, or athletic trainer, complete this stretch with your elbow bent, rather than extended. RANGE OF MOTION - Supination, Active   Stand or sit with your elbows at your side. Bend your  right / left elbow to 90 degrees.  Turn your palm upward until you feel a gentle stretch on the inside of your forearm.  Hold this position for __________ seconds. Slowly release and return to the starting position. Repeat __________ times. Complete this stretch __________ times per day.  RANGE OF MOTION - Pronation, Active   Stand or sit with your elbows at your side. Bend your right / left elbow to 90 degrees.  Turn your palm downward until you feel a gentle stretch on the top of your forearm.  Hold this position for __________ seconds. Slowly release and return to the starting position. Repeat __________ times. Complete this stretch __________ times per day.  STRENGTHENING EXERCISES  These exercises may help you when beginning to rehabilitate your injury. They may resolve your symptoms with or without further involvement from your physician, physical therapist, or athletic trainer. While completing these exercises, remember:   Muscles can gain both the endurance and the strength needed for everyday activities through controlled exercises.  Complete these exercises as instructed by your physician, physical therapist, or athletic trainer. Progress the resistance and repetitions only as guided.  You may experience muscle soreness or fatigue, but the pain or discomfort you are trying to eliminate should never worsen during these exercises. If this pain does worsen, stop and make certain you are following the directions exactly. If the pain is still present after adjustments, discontinue the exercise until you can discuss the trouble with your clinician. STRENGTH - Wrist Flexors  Sit with your right / left forearm palm-up and fully supported. Your elbow should be resting below the  height of your shoulder. Allow your wrist to extend over the edge of the surface.  Loosely holding a __________ weight or a piece of rubber exercise band/tubing, slowly curl your hand up toward your forearm.  Hold  this position for __________ seconds. Slowly lower the wrist back to the starting position in a controlled manner. Repeat __________ times. Complete this exercise __________ times per day.  STRENGTH - Wrist Extensors  Sit with your right / left forearm palm-down and fully supported. Your elbow should be resting below the height of your shoulder. Allow your wrist to extend over the edge of the surface.  Loosely holding a __________ weight or a piece of rubber exercise band/tubing, slowly curl your hand up toward your forearm.  Hold this position for __________ seconds. Slowly lower the wrist back to the starting position in a controlled manner. Repeat __________ times. Complete this exercise __________ times per day.  STRENGTH - Forearm Supinators  Sit with your right / left forearm supported on a table, keeping your elbow below shoulder height. Rest your hand over the edge, palm down.  Gently grip a hammer or a soup ladle.  Without moving your elbow, slowly turn your palm and hand upward to a "thumbs-up" position.  Hold this position for __________ seconds. Slowly return to the starting position. Repeat __________ times. Complete this exercise __________ times per day.  STRENGTH - Forearm Pronators   Sit with your right / left forearm supported on a table, keeping your elbow below shoulder height. Rest your hand over the edge, palm up.  Gently grip a hammer or a soup ladle.  Without moving your elbow, slowly turn your palm and hand upward to a "thumbs-up" position.  Hold this position for __________ seconds. Slowly return to the starting position. Repeat __________ times. Complete this exercise __________ times per day.  STRENGTH - Grip  Grasp a tennis ball, a dense sponge, or a large, rolled sock in your hand.  Squeeze as hard as you can without increasing any pain.  Hold this position for __________ seconds. Release your grip slowly. Repeat __________ times. Complete this  exercise __________ times per day.  Document Released: 07/31/2005 Document Revised: 01/31/2014 Document Reviewed: 12/29/2008 [facility] Patient Information 2015 [place], [place]. This information is not intended to replace advice given to you by your health care provider. Make sure you discuss any questions you have with your health care provider.

## 2014-12-11 NOTE — Progress Notes (Signed)
[facility] [address], [place] [id number] [phone]  Date:  12/11/2014   Name:  Thomas GuppyCharles D Brady   DOB:  07-Jun-1961   MRN:  [id number]  PCP:  No PCP Per Patient    Chief Complaint: Wrist Pain   History of Present Illness:  Thomas Brady is a 54 y.o. very pleasant male patient who presents with the following:  Slight ache at his wrist, which started after he stepped off a curb, and his arms jerked at his sides.  He states the pain shoots up the lateral side of his right arm from his wrist, but does not exceed past his elbow.  There is no swelling, redness, or rash.  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     Patient Active Problem List   Diagnosis Date Noted  . Herpes keratitis     Past Medical History  Diagnosis Date  . Hypertension   . GERD (gastroesophageal reflux disease)   . Herpes keratitis   . Cataract     Past Surgical History  Procedure Laterality Date  . Vasectomy    . Eye surgery      History  Substance Use Topics  . Smoking status: Never Smoker   . Smokeless tobacco: Not on file  . Alcohol Use: No    Family History  Problem Relation Age of Onset  . Hyperlipidemia Father   . Hypertension Father   . Hyperlipidemia Mother     No Known Allergies  Medication list has been reviewed and updated.  Current Outpatient Prescriptions on File Prior to Visit  Medication Sig Dispense Refill  . lisinopril (PRINIVIL,ZESTRIL) 10 MG tablet Take 1 tablet (10 mg total) by mouth daily. 90 tablet 3  . pantoprazole (PROTONIX) 40 MG tablet Take 1 tablet (40 mg total) by mouth daily. 90 tablet 3  . valACYclovir (VALTREX) 500 MG tablet Take 1 tablet (500 mg total) by mouth daily. 90 tablet 3  . fish oil-omega-3 fatty acids 1000 MG capsule Take 1 g by mouth daily.     No current facility-administered medications on file prior to visit.    Review of Systems: ROS otherwise unremarkable unless listed above.    Physical  Examination: Filed Vitals:   12/11/14 1224  BP: 120/82  Pulse: 91  Temp: 98.5 F (36.9 C)  Resp: 20   Filed Vitals:   12/11/14 1224  Height: 5' 10.75" (1.797 m)  Weight: 218 lb 2 oz (98.941 kg)   Body mass index is 30.64 kg/(m^2). Ideal Body Weight: Weight in (lb) to have BMI = 25: 177.6  Physical Exam  Constitutional: He is oriented to person, place, and time. He appears well-developed and well-nourished. No distress.  HENT:  Head: Normocephalic and atraumatic.  Eyes: EOM are normal. Pupils are equal, round, and reactive to light.  Cardiovascular: Normal rate, regular rhythm and normal heart sounds.   Pulmonary/Chest: Effort normal and breath sounds normal. No respiratory distress. He has no wheezes.  Neurological: He is alert and oriented to person, place, and time.  Skin: Skin is warm and dry. No rash noted.  No spinous tenderness. Normal cervical ROM.  Normal shoulder ROM.  Normal strength with elbow flexion extension.  Normal hand grip strength.  Normal radial pulse.  Negative Tinel, and phalen.  Negative dequervain.  Tingling symptoms sparked with abduction of shoulder.  Sharp/dull 4/5 at lateral proximal to wrist.  Normal sensation distally at the hands and digits.  Psychiatric: He has a normal mood and affect. His behavior is normal.     Assessment and Plan: 54 year old male is here today for wrist pain. Treating for possible inflammation at the nerve sight.  Though he has sensation distally to numb site, I will treat with anti-inflammatory and muscle relaxant.    Muscle strain - Plan: cyclobenzaprine (FLEXERIL) 10 MG tablet, meloxicam (MOBIC) 15 MG tablet,   Trena Platt, PA-C [facility] 3/15/201612:24 PM

## 2015-07-29 ENCOUNTER — Other Ambulatory Visit: Payer: Self-pay | Admitting: Family Medicine

## 2016-01-16 ENCOUNTER — Other Ambulatory Visit: Payer: Self-pay | Admitting: Family Medicine

## 2016-01-23 ENCOUNTER — Ambulatory Visit (INDEPENDENT_AMBULATORY_CARE_PROVIDER_SITE_OTHER): Payer: 59 | Admitting: Emergency Medicine

## 2016-01-23 ENCOUNTER — Ambulatory Visit: Payer: Self-pay

## 2016-01-23 ENCOUNTER — Ambulatory Visit (INDEPENDENT_AMBULATORY_CARE_PROVIDER_SITE_OTHER): Payer: 59

## 2016-01-23 VITALS — BP 124/86 | HR 87 | Temp 98.5°F | Resp 17 | Ht 70.5 in | Wt 216.0 lb

## 2016-01-23 DIAGNOSIS — R05 Cough: Secondary | ICD-10-CM | POA: Diagnosis not present

## 2016-01-23 DIAGNOSIS — R0981 Nasal congestion: Secondary | ICD-10-CM

## 2016-01-23 DIAGNOSIS — M25511 Pain in right shoulder: Secondary | ICD-10-CM

## 2016-01-23 DIAGNOSIS — H16201 Unspecified keratoconjunctivitis, right eye: Secondary | ICD-10-CM

## 2016-01-23 DIAGNOSIS — R030 Elevated blood-pressure reading, without diagnosis of hypertension: Secondary | ICD-10-CM | POA: Diagnosis not present

## 2016-01-23 DIAGNOSIS — I10 Essential (primary) hypertension: Secondary | ICD-10-CM | POA: Diagnosis not present

## 2016-01-23 DIAGNOSIS — R059 Cough, unspecified: Secondary | ICD-10-CM

## 2016-01-23 DIAGNOSIS — IMO0001 Reserved for inherently not codable concepts without codable children: Secondary | ICD-10-CM

## 2016-01-23 DIAGNOSIS — M25512 Pain in left shoulder: Secondary | ICD-10-CM

## 2016-01-23 LAB — POCT CBC
GRANULOCYTE PERCENT: 57.9 % (ref 37–80)
HEMATOCRIT: 46.5 % (ref 43.5–53.7)
Hemoglobin: 17 g/dL (ref 14.1–18.1)
LYMPH, POC: 2.8 (ref 0.6–3.4)
MCH: 32.3 pg — AB (ref 27–31.2)
MCHC: 36.5 g/dL — AB (ref 31.8–35.4)
MCV: 88.4 fL (ref 80–97)
MID (CBC): 0.5 (ref 0–0.9)
MPV: 8.7 fL (ref 0–99.8)
PLATELET COUNT, POC: 167 10*3/uL (ref 142–424)
POC GRANULOCYTE: 4.6 (ref 2–6.9)
POC LYMPH %: 35.8 % (ref 10–50)
POC MID %: 6.3 %M (ref 0–12)
RBC: 5.26 M/uL (ref 4.69–6.13)
RDW, POC: 12.3 %
WBC: 7.9 10*3/uL (ref 4.6–10.2)

## 2016-01-23 LAB — BASIC METABOLIC PANEL WITH GFR
BUN: 11 mg/dL (ref 7–25)
CHLORIDE: 105 mmol/L (ref 98–110)
CO2: 26 mmol/L (ref 20–31)
CREATININE: 1.49 mg/dL — AB (ref 0.70–1.33)
Calcium: 9.3 mg/dL (ref 8.6–10.3)
GFR, Est African American: 61 mL/min (ref >=60)
GFR, Est Non African American: 53 mL/min — ABNORMAL LOW (ref >=60)
GLUCOSE: 93 mg/dL (ref 65–99)
Potassium: 4.1 mmol/L (ref 3.5–5.3)
SODIUM: 139 mmol/L (ref 135–146)

## 2016-01-23 MED ORDER — PANTOPRAZOLE SODIUM 40 MG PO TBEC: DELAYED_RELEASE_TABLET | ORAL | Status: DC

## 2016-01-23 MED ORDER — VALACYCLOVIR HCL 500 MG PO TABS: 500.0000 mg | ORAL_TABLET | Freq: Every day | ORAL | Status: DC

## 2016-01-23 MED ORDER — LOSARTAN POTASSIUM 50 MG PO TABS: 50.0000 mg | ORAL_TABLET | Freq: Every day | ORAL | Status: DC

## 2016-01-23 NOTE — Patient Instructions (Addendum)
I have changed your blood pressure medicine. I have added a nasal spray for sinus symptoms. I have made a referral for you to see an orthopedist regarding your bilateral shoulder discomfort.    IF you received an x-ray today, you will receive an invoice from [facility]. Please contact [facility] at [phone] with questions or concerns regarding your invoice.   IF you received labwork today, you will receive an invoice from [facility]. Please contact [facility] at [phone] with questions or concerns regarding your invoice.   Our billing staff will not be able to assist you with questions regarding bills from these companies.  You will be contacted with the lab results as soon as they are available. The fastest way to get your results is to activate your My Chart account. Instructions are located on the last page of this paperwork. If you have not heard from us regarding the results in 2 weeks, please contact this office.

## 2016-01-23 NOTE — Progress Notes (Addendum)
Patient ID: Thomas Brady, male   DOB: 1961-02-07, 55 y.o.   MRN: [id number]     By signing my name below, I, Essence Howell, attest that this documentation has been prepared under the direction and in the presence of Collene GobbleSteven A Jamika Sadek, MD Electronically Signed: Charline BillsEssence Howell, ED Scribe 01/23/2016 at 12:15 PM  Chief Complaint:  Chief Complaint  Patient presents with  . Medication Refill    lisinopril,protonix,valtrex  . Cough  . Sinusitis  . Shoulder Pain   HPI: Thomas Brady is a 55 y.o. male [facility] medication refill of Lisinopril, Protonix and Valtrex.  Cough  Pt reports persistent dry cough for several years. He states that he has been without his Lisinopril for the past month but has still had the cough. He denies heartburn but reports h/o GERD which he treats with protonix. No other treatments tried PTA. Pt has never had a CXR.   Sinusitis  Pt also presents with constant upper dental pain for the past few days which he attributes to the beginning stages of sinusitis. He states that he recently had a cold sore, sinus pressure and cold-like symptoms that self-resolved.   Shoulder Pain Pt reports gradually worsening bilateral shoulder pain for the past year. 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 He reports increased pain with movement. No treatments tried PTA.   Preventative Maintenance  Pt's last colonoscopy was in 2014.   Past Medical History  Diagnosis Date  . Hypertension   . GERD (gastroesophageal reflux disease)   . Herpes keratitis   . Cataract    Past Surgical History  Procedure Laterality Date  . Vasectomy    . Eye surgery     Social History   Social History  . Marital Status: Married    Spouse Name: N/A  . Number of Children: N/A  . Years of Education: N/A   Social History Main Topics  . Smoking status: Never Smoker   . Smokeless tobacco: None  . Alcohol Use: No   . Drug Use: No  . Sexual Activity: Yes    Birth Control/ Protection: Surgical   Other Topics Concern  . None   Social History Narrative   Family History  Problem Relation Age of Onset  . Hyperlipidemia Father   . Hypertension Father   . Hyperlipidemia Mother    No Known Allergies Prior to Admission medications   Medication Sig Start Date End Date Taking? Authorizing Provider  pantoprazole (PROTONIX) 40 MG tablet TAKE 1 TABLET (40 MG TOTAL) BY MOUTH DAILY  "OFFICE VISIT NEEDED FOR REFILLS" 07/30/15  Yes Elvina SidleKurt Lauenstein, MD  prednisoLONE acetate (PRED FORTE) 1 % ophthalmic suspension Place 1 drop into the right eye daily.   Yes Historical Provider, MD  valACYclovir (VALTREX) 500 MG tablet Take 1 tablet (500 mg total) by mouth daily. 07/12/14  Yes Elvina SidleKurt Lauenstein, MD  lisinopril (PRINIVIL,ZESTRIL) 10 MG tablet Take 1 tablet (10 mg total) by mouth daily. Patient not taking: Reported on 01/23/2016 07/12/14 01/23/16  Elvina SidleKurt Lauenstein, MD     ROS: The patient denies fevers, chills, night sweats, unintentional weight loss, chest pain, palpitations, wheezing, dyspnea on exertion, nausea, vomiting, abdominal pain, dysuria, hematuria, melena, numbness, weakness, or tingling.   All other systems have been reviewed and were otherwise negative with the exception of those mentioned in the HPI and as above.    PHYSICAL EXAM:  Body mass  index is 30.54 kg/(m^2).  General: Alert, no acute distress HEENT:  Normocephalic, atraumatic, oropharynx patent. Eye: [facility], [facility]. Cataract extraction. Cardiovascular:  Regular rate and rhythm, no rubs murmurs or gallops.  No Carotid bruits, radial pulse intact. No pedal edema.  Respiratory: Clear to auscultation bilaterally.  No wheezes, rales, or rhonchi.  No cyanosis, no use of accessory musculature Abdominal: No organomegaly, abdomen is soft and non-tender, positive bowel sounds.  No masses. Musculoskeletal: Gait intact. No edema. Discomfort with full  internal rotation. Difficulty with full Abduction of arm. No rotator cuff weakness.  Skin: No rashes. Neurologic: Facial musculature symmetric. Psychiatric: Patient acts appropriately throughout our interaction. Lymphatic: No cervical or submandibular lymphadenopathy  LABS: Results for orders placed or performed in visit on 01/23/16  POCT CBC  Result Value Ref Range   WBC 7.9 4.6 - 10.2 K/uL   Lymph, poc 2.8 0.6 - 3.4   POC LYMPH PERCENT 35.8 10 - 50 %L   MID (cbc) 0.5 0 - 0.9   POC MID % 6.3 0 - 12 %M   POC Granulocyte 4.6 2 - 6.9   Granulocyte percent 57.9 37 - 80 %G   RBC 5.26 4.69 - 6.13 M/uL   Hemoglobin 17.0 14.1 - 18.1 g/dL   HCT, POC 16.1 09.6 - 53.7 %   MCV 88.4 80 - 97 fL   MCH, POC 32.3 (A) 27 - 31.2 pg   MCHC 36.5 (A) 31.8 - 35.4 g/dL   RDW, POC 04.5 %   Platelet Count, POC 167 142 - 424 K/uL   MPV 8.7 0 - 99.8 fL   EKG/XRAY:   Primary read interpreted by Dr. Cleta Alberts at [facility] Dg Sinus 1-2 Views  01/23/2016  CLINICAL DATA:  Constant upper dental pain for the past several days possibly related to sinusitis EXAM: PARANASAL SINUSES - 1-2 VIEW COMPARISON:  None in PACs FINDINGS: The frontal, ethmoid, and maxillary sinuses are well pneumatized. No air-fluid levels or mucoperiosteal thickening is observed. IMPRESSION: There is no radiographic evidence of acute sinusitis on this single [place] view. Electronically Signed   By: David  [place] M.D.   On: 01/23/2016 12:41   Dg Chest 2 View  01/23/2016  CLINICAL DATA:  Dry cough for several years. EXAM: CHEST  2 VIEW COMPARISON:  None FINDINGS: The heart size and mediastinal contours are within normal limits. Both lungs are clear. The visualized skeletal structures are unremarkable. IMPRESSION: No active cardiopulmonary disease. Electronically Signed   By: Signa Kell M.D.   On: 01/23/2016 12:40   Dg Shoulder Right  01/23/2016  CLINICAL DATA:  Worsening bilateral shoulder pain over the past year which is worse with movement. No  known injury. Initial encounter. EXAM: RIGHT SHOULDER - 2+ VIEW COMPARISON:  None. FINDINGS: There is no evidence of fracture or dislocation. There is no evidence of arthropathy or other focal bone abnormality. Soft tissues are unremarkable. IMPRESSION: Normal exam. Electronically Signed   By: Drusilla Kanner M.D.   On: 01/23/2016 12:40   Dg Shoulder Left  01/23/2016  CLINICAL DATA:  Chronic left shoulder pain without reported injury. EXAM: LEFT SHOULDER - 2+ VIEW COMPARISON:  None. FINDINGS: There is no evidence of fracture or dislocation. There is no evidence of arthropathy or other focal bone abnormality. Soft tissues are unremarkable. IMPRESSION: Normal left shoulder. Electronically Signed   By: Lupita Raider, M.D.   On: 01/23/2016 12:40     ASSESSMENT/PLAN:  Referral made to orthopedics for his shoulder discomfort. I did change  his blood pressure medication to see if that helps with his cough.I personally performed the services described in this documentation, which was scribed in my presence. The recorded information has been reviewed and is accurate. His valtrex was  refilled and he was placed on Flonase for sinus symptoms.I personally performed the services described in this documentation, which was scribed in my presence. The recorded information has been reviewed and is accurate.    Gross sideeffects, risk and benefits, and alternatives of medications d/w patient. Patient is aware that all medications have potential sideeffects and we are unable to predict every sideeffect or drug-drug interaction that may occur.  Lesle Chris MD 01/23/2016 11:37 AM

## 2016-02-02 ENCOUNTER — Encounter: Payer: Self-pay | Admitting: *Deleted

## 2016-02-09 ENCOUNTER — Telehealth: Payer: Self-pay

## 2016-02-09 NOTE — Telephone Encounter (Signed)
Pt called for his lab results he was informed

## 2016-02-28 ENCOUNTER — Other Ambulatory Visit (INDEPENDENT_AMBULATORY_CARE_PROVIDER_SITE_OTHER): Payer: 59

## 2016-02-28 ENCOUNTER — Other Ambulatory Visit: Payer: Self-pay | Admitting: *Deleted

## 2016-02-28 DIAGNOSIS — R7989 Other specified abnormal findings of blood chemistry: Secondary | ICD-10-CM

## 2016-02-28 DIAGNOSIS — R748 Abnormal levels of other serum enzymes: Secondary | ICD-10-CM

## 2016-02-28 LAB — BASIC METABOLIC PANEL WITH GFR
BUN: 13 mg/dL (ref 7–25)
CHLORIDE: 102 mmol/L (ref 98–110)
CO2: 26 mmol/L (ref 20–31)
CREATININE: 1.46 mg/dL — AB (ref 0.70–1.33)
Calcium: 9.7 mg/dL (ref 8.6–10.3)
GFR, Est African American: 63 mL/min (ref >=60)
GFR, Est Non African American: 54 mL/min — ABNORMAL LOW (ref >=60)
GLUCOSE: 95 mg/dL (ref 65–99)
POTASSIUM: 4.6 mmol/L (ref 3.5–5.3)
Sodium: 138 mmol/L (ref 135–146)

## 2016-02-28 NOTE — Progress Notes (Signed)
Pt here for lab only visit 

## 2017-02-12 ENCOUNTER — Other Ambulatory Visit: Payer: Self-pay | Admitting: Emergency Medicine

## 2017-02-12 DIAGNOSIS — H16201 Unspecified keratoconjunctivitis, right eye: Secondary | ICD-10-CM

## 2017-02-19 ENCOUNTER — Other Ambulatory Visit: Payer: Self-pay | Admitting: Emergency Medicine

## 2017-02-19 NOTE — Telephone Encounter (Signed)
Please call patient. Rx authorized. Needs to schedule follow-up and establish with new PCP.  Meds ordered this encounter  Medications  . pantoprazole (PROTONIX) 40 MG tablet    Sig: TAKE 1 TABLET (40 MG TOTAL) BY MOUTH DAILY    Dispense:  30 tablet    Refill:  0    Please notify patient that s/he needs an office visit +/- labsfor additional refills.  Marland Kitchen. losartan (COZAAR) 50 MG tablet    Sig: TAKE 1 TABLET (50 MG TOTAL) BY MOUTH DAILY.    Dispense:  30 tablet    Refill:  0    Please notify patient that s/he needs an office visit +/- labsfor additional refills.

## 2017-03-20 ENCOUNTER — Other Ambulatory Visit: Payer: Self-pay | Admitting: Physician Assistant

## 2017-03-20 NOTE — Telephone Encounter (Signed)
Please call patient. Rx authorized. Not seen since 12/2015. Needs to schedule follow-up and establish with PCP.  Meds ordered this encounter  Medications  . pantoprazole (PROTONIX) 40 MG tablet    Sig: TAKE 1 TABLET BY MOUTH EVERY DAY    Dispense:  30 tablet    Refill:  0    Please notify patient that s/he needs an office visit +/- labsfor additional refills.  Marland Kitchen. losartan (COZAAR) 50 MG tablet    Sig: TAKE 1 TABLET BY MOUTH EVERY DAY    Dispense:  30 tablet    Refill:  0    Please notify patient that s/he needs an office visit +/- labsfor additional refills.

## 2017-04-18 ENCOUNTER — Other Ambulatory Visit: Payer: Self-pay | Admitting: Physician Assistant

## 2017-05-09 ENCOUNTER — Other Ambulatory Visit: Payer: Self-pay | Admitting: Family Medicine

## 2017-05-09 DIAGNOSIS — H16201 Unspecified keratoconjunctivitis, right eye: Secondary | ICD-10-CM

## 2017-12-08 DIAGNOSIS — H52213 Irregular astigmatism, bilateral: Secondary | ICD-10-CM | POA: Diagnosis not present

## 2017-12-09 DIAGNOSIS — Z961 Presence of intraocular lens: Secondary | ICD-10-CM | POA: Diagnosis not present

## 2017-12-09 DIAGNOSIS — H26493 Other secondary cataract, bilateral: Secondary | ICD-10-CM | POA: Diagnosis not present

## 2017-12-31 DIAGNOSIS — Z961 Presence of intraocular lens: Secondary | ICD-10-CM | POA: Diagnosis not present

## 2017-12-31 DIAGNOSIS — H26491 Other secondary cataract, right eye: Secondary | ICD-10-CM | POA: Diagnosis not present

## 2019-08-20 ENCOUNTER — Other Ambulatory Visit: Payer: Self-pay

## 2019-08-20 DIAGNOSIS — Z20822 Contact with and (suspected) exposure to covid-19: Secondary | ICD-10-CM

## 2019-08-23 LAB — NOVEL CORONAVIRUS, NAA: SARS-CoV-2, NAA: NOT DETECTED
